# Patient Record
Sex: Male | Born: 2005 | Race: Black or African American | Hispanic: No | Marital: Single | Smoking: Never smoker
Health system: Southern US, Community
[De-identification: ages and names within clinical notes are randomized; demographics above are authoritative.]

## PROBLEM LIST (undated history)

## (undated) DIAGNOSIS — L309 Dermatitis, unspecified: Secondary | ICD-10-CM

## (undated) DIAGNOSIS — J45909 Unspecified asthma, uncomplicated: Secondary | ICD-10-CM

## (undated) DIAGNOSIS — J069 Acute upper respiratory infection, unspecified: Secondary | ICD-10-CM

## (undated) HISTORY — PX: THROAT SURGERY: SHX803

## (undated) HISTORY — DX: Acute upper respiratory infection, unspecified: J06.9

## (undated) HISTORY — PX: CIRCUMCISION: SUR203

## (undated) HISTORY — DX: Dermatitis, unspecified: L30.9

---
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
## 2023-01-02 ENCOUNTER — Ambulatory Visit: Payer: Medicaid Other

## 2023-01-02 ENCOUNTER — Other Ambulatory Visit: Payer: Self-pay

## 2023-01-02 ENCOUNTER — Ambulatory Visit (INDEPENDENT_AMBULATORY_CARE_PROVIDER_SITE_OTHER): Payer: Medicaid Other | Admitting: Family Medicine

## 2023-01-02 VITALS — BP 104/60 | HR 64 | Temp 98.1°F | Resp 16 | Wt 132.4 lb

## 2023-01-02 DIAGNOSIS — L2084 Intrinsic (allergic) eczema: Secondary | ICD-10-CM

## 2023-01-02 DIAGNOSIS — T7800XD Anaphylactic reaction due to unspecified food, subsequent encounter: Secondary | ICD-10-CM

## 2023-01-02 DIAGNOSIS — H1013 Acute atopic conjunctivitis, bilateral: Secondary | ICD-10-CM

## 2023-01-02 DIAGNOSIS — J3089 Other allergic rhinitis: Secondary | ICD-10-CM | POA: Diagnosis not present

## 2023-01-02 DIAGNOSIS — J302 Other seasonal allergic rhinitis: Secondary | ICD-10-CM

## 2023-01-02 DIAGNOSIS — T7800XA Anaphylactic reaction due to unspecified food, initial encounter: Secondary | ICD-10-CM

## 2023-01-02 DIAGNOSIS — L209 Atopic dermatitis, unspecified: Secondary | ICD-10-CM

## 2023-01-02 DIAGNOSIS — H1010 Acute atopic conjunctivitis, unspecified eye: Secondary | ICD-10-CM

## 2023-01-02 DIAGNOSIS — J4540 Moderate persistent asthma, uncomplicated: Secondary | ICD-10-CM

## 2023-01-02 NOTE — Progress Notes (Signed)
[address] [place] [address] Dept: [phone]  FOLLOW UP NOTE  Patient ID: Travis Jones, male    DOB: 09-Mar-2005  Age: 17 y.o. MRN: [id number] Date of Office Visit: 01/02/2023  Assessment  Chief Complaint: Follow-up  HPI Travis Jones is a 17 year old male who presents to the clinic for follow-up visit.  He was last seen in this clinic on 11/21/2022 by Nehemiah Settle, FNP, for evaluation of asthma, allergic rhinitis, allergic conjunctivitis, atopic dermatitis, and food allergy to peanut, tree nuts, and stovetop egg.  He is accompanied by his mother who assists with history.  At today's visit, he reports his asthma has been well-controlled with no shortness of breath, cough, or wheeze with activity or rest.  He is rarely using Flovent 110 or albuterol for asthma control or asthma symptoms.    Allergic rhinitis is reported as moderately well-controlled with occasional nasal symptoms for which he continues levocetirizine daily.  He is not currently using nasal saline rinses or Dymista nasal spray.  His last environmental allergy testing via lab was on 07/18/2022 and was positive to the entire panel.    Atopic dermatitis is reported as moderately well-controlled with occasional red and itchy areas occurring mainly on his face.  He continues a twice a day moisturizing routine as well as occasional tacrolimus.  He continues Dupixent injections with no large or local reactions.  He reports a significant decrease in his symptoms of atopic dermatitis while continuing on Dupixent injections.  He reports that he does feel tingling and some light when he is having an atopic dermatitis flare.  He reports that he frequently does not wear sunscreen or clothing or hat that protects his face from sunlight.  He is not interested in stopping Dupixent at this time or switching to a different medication to control of atopic dermatitis.  We discussed Rinvok, Ebglyss, and Adbry.    He continues to avoid  peanuts, tree nuts, and eggs in all forms with no accidental ingestion or EpiPen use since his last visit to this clinic.  His last food allergy testing by lab was positive to peanuts, tree nuts, and egg.  EpiPen sent is up-to-date.  Drug Allergies:  Allergies  Allergen Reactions   Egg-Derived Products Anaphylaxis   Peanuts [Peanut Oil] Anaphylaxis    Physical Exam: BP 104/60   Pulse 64   Temp 98.1 F (36.7 C) (Temporal)   Resp 16   Wt 132 lb 6.4 oz (60.1 kg)   SpO2 99%   BMI 20.42 kg/m    Physical Exam Vitals reviewed.  Constitutional:      Appearance: Normal appearance.  HENT:     Head: Normocephalic and atraumatic.     Right Ear: Tympanic membrane normal.     Left Ear: Tympanic membrane normal.     Nose:     Comments: Bilateral naris slightly erythematous with thin clear nasal drainage noted.  Pharynx normal.  Ears normal.  Eyes normal.    Mouth/Throat:     Pharynx: Oropharynx is clear.  Eyes:     Conjunctiva/sclera: Conjunctivae normal.  Cardiovascular:     Rate and Rhythm: Normal rate and regular rhythm.     Heart sounds: Normal heart sounds. No murmur heard. Pulmonary:     Effort: Pulmonary effort is normal.     Breath sounds: Normal breath sounds.     Comments: Lungs clear to auscultation Musculoskeletal:        General: Normal range of motion.  Cervical back: Normal range of motion and neck supple.  Skin:    General: Skin is warm and dry.     Comments: Some eczematous patches mainly noted around his eyes.  No open areas or drainage noted.  Neurological:     Mental Status: He is alert and oriented to person, place, and time.  Psychiatric:        Mood and Affect: Mood normal.        Behavior: Behavior normal.        Thought Content: Thought content normal.        Judgment: Judgment normal.     Diagnostics: FVC 4.58 which is 106% of predicted value, FEV1 3.30 which is 89% of predicted value.  Spirometry indicates airway obstruction.   Postbronchodilator spirometry indicates 11% improvement in FEV1.  Follow-up with all  Assessment and Plan: 1. Not well controlled moderate persistent asthma   2. Seasonal and perennial allergic rhinitis   3. Seasonal allergic conjunctivitis   4. Intrinsic atopic dermatitis   5. Anaphylaxis due to food     No orders of the defined types were placed in this encounter.   Patient Instructions  Asthma Begin Flovent 110 mcg 2 puffs twice a day to prevent cough or wheeze. Make sure and use this every day as prescribed. Set a reminder on your phone Continue albuterol 2 puffs once every 4 hours as needed for cough or wheeze You may use albuterol 2 puffs 5 to 15 minutes before activity to decrease cough or wheeze  Asthma control goals:  Full participation in all desired activities (may need albuterol before activity) Albuterol use two time or less a week on average (not counting use with activity) Cough interfering with sleep two time or less a month Oral steroids no more than once a year No hospitalizations  Allergic rhinitis Continue allergen avoidance measures directed toward pollen, mold, dust mites, pets, and cockroach as listed below Continue Xyzal 5 mg once a day as needed for runny nose or itch. Continue Dymista 2 sprays in each nostril as needed for nasal symptoms Consider saline nasal rinses as needed for nasal symptoms. Use this before any medicated nasal sprays for best result Consider allergen immunotherapy if your symptoms are not well-controlled with the treatment plan as listed above  Allergic conjunctivitis Continue olopatadine 1 drop in each eye once a day as needed for red or itchy eyes  Atopic dermatitis Continue a twice a day moisturizing routine Continue Protopic (tacrolimus) use 1 application sparingly twice a day as needed to red and itchy areas. This is not a steroid and safe to use on face and neck Continue triamcinolone (moderate strength steroid)  using 1  application sparingly twice a day as needed to red itchy areas. Do not use on face, neck, groin, or armpit region.   Do not use this medication for longer than 2 weeks in a row Continue clobetasol (strong steroid) to stubborn red and itchy areas. Use one application sparingly once  a day as needed. Do not use on face, neck, groin, or armpit region. Do not use this medication for longer than 1 week in a row  Food allergy Stable Continue to avoid peanuts, tree nuts, and stovetop egg. In case of an allergic reaction, take Benadryl 50 mg every 4 hours, and if life-threatening symptoms occur, inject with EpiPen 0.3 mg.   Call the clinic if this treatment plan is not working well for you.  Follow up in 4 months or sooner  if needed.   Return in about 4 months (around 05/02/2023), or if symptoms worsen or fail to improve.    Thank you for the opportunity to care for this patient.  Please do not hesitate to contact me with questions.  Thermon Leyland, FNP [facility]

## 2023-01-02 NOTE — Patient Instructions (Addendum)
Asthma Begin Flovent 110 mcg 2 puffs twice a day to prevent cough or wheeze. Make sure and use this every day as prescribed. Set a reminder on your phone Continue albuterol 2 puffs once every 4 hours as needed for cough or wheeze You may use albuterol 2 puffs 5 to 15 minutes before activity to decrease cough or wheeze  Asthma control goals:  Full participation in all desired activities (may need albuterol before activity) Albuterol use two time or less a week on average (not counting use with activity) Cough interfering with sleep two time or less a month Oral steroids no more than once a year No hospitalizations  Allergic rhinitis Continue allergen avoidance measures directed toward pollen, mold, dust mites, pets, and cockroach as listed below Continue Xyzal 5 mg once a day as needed for runny nose or itch. Continue Dymista 2 sprays in each nostril as needed for nasal symptoms Consider saline nasal rinses as needed for nasal symptoms. Use this before any medicated nasal sprays for best result Consider allergen immunotherapy if your symptoms are not well-controlled with the treatment plan as listed above  Allergic conjunctivitis Continue olopatadine 1 drop in each eye once a day as needed for red or itchy eyes  Atopic dermatitis Continue a twice a day moisturizing routine Continue Protopic (tacrolimus) use 1 application sparingly twice a day as needed to red and itchy areas. This is not a steroid and safe to use on face and neck Continue triamcinolone (moderate strength steroid)  using 1 application sparingly twice a day as needed to red itchy areas. Do not use on face, neck, groin, or armpit region.   Do not use this medication for longer than 2 weeks in a row Continue clobetasol (strong steroid) to stubborn red and itchy areas. Use one application sparingly once  a day as needed. Do not use on face, neck, groin, or armpit region. Do not use this medication for longer than 1 week in a  row  Food allergy Stable Continue to avoid peanuts, tree nuts, and stovetop egg. In case of an allergic reaction, take Benadryl 50 mg every 4 hours, and if life-threatening symptoms occur, inject with EpiPen 0.3 mg.   Call the clinic if this treatment plan is not working well for you.  Follow up in 4 months or sooner if needed.  Reducing Pollen Exposure The American Academy of Allergy, Asthma and Immunology suggests the following steps to reduce your exposure to pollen during allergy seasons. Do not hang sheets or clothing out to dry; pollen may collect on these items. Do not mow lawns or spend time around freshly cut grass; mowing stirs up pollen. Keep windows closed at night.  Keep car windows closed while driving. Minimize morning activities outdoors, a time when pollen counts are usually at their highest. Stay indoors as much as possible when pollen counts or humidity is high and on windy days when pollen tends to remain in the air longer. Use air conditioning when possible.  Many air conditioners have filters that trap the pollen spores. Use a HEPA room air filter to remove pollen form the indoor air you breathe.  Control of Mold Allergen Mold and fungi can grow on a variety of surfaces provided certain temperature and moisture conditions exist.  Outdoor molds grow on plants, decaying vegetation and soil.  The major outdoor mold, Alternaria and Cladosporium, are found in very high numbers during hot and dry conditions.  Generally, a late Summer - Fall peak is seen  for common outdoor fungal spores.  Rain will temporarily lower outdoor mold spore count, but counts rise rapidly when the rainy period ends.  The most important indoor molds are Aspergillus and Penicillium.  Dark, humid and poorly ventilated basements are ideal sites for mold growth.  The next most common sites of mold growth are the bathroom and the kitchen.  Outdoor [employer] Use air conditioning and keep windows  closed Avoid exposure to decaying vegetation. Avoid leaf raking. Avoid grain handling. Consider wearing a face mask if working in moldy areas.  Indoor Mold Control Maintain humidity below 50%. Clean washable surfaces with 5% bleach solution. Remove sources e.g. Contaminated carpets.   Control of Dust Mite Allergen Dust mites play a major role in allergic asthma and rhinitis. They occur in environments with high humidity wherever human skin is found. Dust mites absorb humidity from the atmosphere (ie, they do not drink) and feed on organic matter (including shed human and animal skin). Dust mites are a microscopic type of insect that you cannot see with the naked eye. High levels of dust mites have been detected from mattresses, pillows, carpets, upholstered furniture, bed covers, clothes, soft toys and any woven material. The principal allergen of the dust mite is found in its feces. A gram of dust may contain 1,000 mites and 250,000 fecal particles. Mite antigen is easily measured in the air during house cleaning activities. Dust mites do not bite and do not cause harm to humans, other than by triggering allergies/asthma.  Ways to decrease your exposure to dust mites in your home:  1. Encase mattresses, box springs and pillows with a mite-impermeable barrier or cover  2. Wash sheets, blankets and drapes weekly in hot water (130 F) with detergent and dry them in a dryer on the hot setting.  3. Have the room cleaned frequently with a vacuum cleaner and a damp dust-mop. For carpeting or rugs, vacuuming with a vacuum cleaner equipped with a high-efficiency particulate air (HEPA) filter. The dust mite allergic individual should not be in a room which is being cleaned and should wait 1 hour after cleaning before going into the room.  4. Do not sleep on upholstered furniture (eg, couches).  5. If possible removing carpeting, upholstered furniture and drapery from the home is ideal. Horizontal  blinds should be eliminated in the rooms where the person spends the most time (bedroom, study, television room). Washable vinyl, roller-type shades are optimal.  6. Remove all non-washable stuffed toys from the bedroom. Wash stuffed toys weekly like sheets and blankets above.  7. Reduce indoor humidity to less than 50%. Inexpensive humidity monitors can be purchased at most hardware stores. Do not use a humidifier as can make the problem worse and are not recommended.  Control of Dog or Cat Allergen Avoidance is the best way to manage a dog or cat allergy. If you have a dog or cat and are allergic to dog or cats, consider removing the dog or cat from the home. If you have a dog or cat but don't want to find it a new home, or if your family wants a pet even though someone in the household is allergic, here are some strategies that may help keep symptoms at bay:  Keep the pet out of your bedroom and restrict it to only a few rooms. Be advised that keeping the dog or cat in only one room will not limit the allergens to that room. Don't pet, hug or kiss the dog  or cat; if you do, wash your hands with soap and water. High-efficiency particulate air (HEPA) cleaners run continuously in a bedroom or living room can reduce allergen levels over time. Regular use of a high-efficiency vacuum cleaner or a central vacuum can reduce allergen levels. Giving your dog or cat a bath at least once a week can reduce airborne allergen.  Control of Cockroach Allergen Cockroach allergen has been identified as an important cause of acute attacks of asthma, especially in urban settings.  There are fifty-five species of cockroach that exist in the [place], however only three, the [place], [place] species produce allergen that can affect patients with Asthma.  Allergens can be obtained from fecal particles, egg casings and secretions from cockroaches.    Remove food sources. Reduce access to water. Seal  access and entry points. Spray runways with 0.5-1% Diazinon or Chlorpyrifos Blow boric acid power under stoves and refrigerator. Place bait stations (hydramethylnon) at feeding sites.

## 2023-01-03 ENCOUNTER — Encounter: Payer: Self-pay | Admitting: Family Medicine

## 2023-01-17 ENCOUNTER — Ambulatory Visit: Payer: Medicaid Other

## 2023-01-21 ENCOUNTER — Other Ambulatory Visit: Payer: Self-pay

## 2023-01-23 ENCOUNTER — Ambulatory Visit: Payer: Medicaid Other | Admitting: *Deleted

## 2023-01-23 ENCOUNTER — Ambulatory Visit: Payer: Medicaid Other

## 2023-01-23 DIAGNOSIS — L209 Atopic dermatitis, unspecified: Secondary | ICD-10-CM | POA: Diagnosis not present

## 2023-01-28 ENCOUNTER — Other Ambulatory Visit (HOSPITAL_COMMUNITY): Payer: Self-pay

## 2023-01-28 NOTE — Progress Notes (Signed)
Specialty Pharmacy Ongoing Clinical Assessment Note  Travis Jones is a 17 y.o. male who is being followed by the specialty pharmacy service for RxSp Atopic Dermatitis   Patient's specialty medication(s) reviewed today: Dupilumab (DUPIXENT)   Missed doses in the last 4 weeks: 0   Patient/Caregiver did not have any additional questions or concerns.   Therapeutic benefit summary: Patient is achieving benefit   Adverse events/side effects summary: No adverse events/side effects   Patient's therapy is appropriate to: Continue    Goals Addressed             This Visit's Progress    Minimize recurrence of flares       Patient is on track. Patient will maintain adherence.  Patient reports that she is well-controlled at this time.         Follow up:  6 months  Servando Snare Specialty Pharmacist

## 2023-01-28 NOTE — Progress Notes (Signed)
Specialty Pharmacy Refill Coordination Note  Travis Jones is a 17 y.o. male contacted today regarding refills of specialty medication(s) Dupilumab (DUPIXENT)   Patient requested Courier to Provider Office   Delivery date: 02/11/23   Verified address: [facility] [address]   Medication will be filled on 02/10/23.

## 2023-02-06 ENCOUNTER — Other Ambulatory Visit: Payer: Self-pay

## 2023-02-10 ENCOUNTER — Other Ambulatory Visit: Payer: Self-pay

## 2023-02-13 ENCOUNTER — Ambulatory Visit (INDEPENDENT_AMBULATORY_CARE_PROVIDER_SITE_OTHER): Payer: Medicaid Other | Admitting: *Deleted

## 2023-02-13 DIAGNOSIS — L209 Atopic dermatitis, unspecified: Secondary | ICD-10-CM

## 2023-02-25 ENCOUNTER — Other Ambulatory Visit (HOSPITAL_COMMUNITY): Payer: Self-pay

## 2023-02-26 ENCOUNTER — Other Ambulatory Visit: Payer: Self-pay

## 2023-02-26 ENCOUNTER — Ambulatory Visit: Payer: Medicaid Other

## 2023-02-26 DIAGNOSIS — L209 Atopic dermatitis, unspecified: Secondary | ICD-10-CM | POA: Diagnosis not present

## 2023-02-26 NOTE — Progress Notes (Signed)
 Specialty Pharmacy Refill Coordination Note  Travis Jones is a 18 y.o. male contacted today regarding refills of specialty medication(s) Dupilumab  (DUPIXENT )   Patient requested Courier to Provider Office   Delivery date: 03/06/23   Verified address: [facility] [address] [place] [address]   Medication will be filled on 03/05/23.

## 2023-03-12 ENCOUNTER — Ambulatory Visit (INDEPENDENT_AMBULATORY_CARE_PROVIDER_SITE_OTHER): Payer: Medicaid Other

## 2023-03-12 DIAGNOSIS — L209 Atopic dermatitis, unspecified: Secondary | ICD-10-CM | POA: Diagnosis not present

## 2023-03-26 ENCOUNTER — Other Ambulatory Visit (HOSPITAL_COMMUNITY): Payer: Self-pay

## 2023-03-26 ENCOUNTER — Other Ambulatory Visit: Payer: Self-pay

## 2023-03-26 ENCOUNTER — Ambulatory Visit: Payer: Medicaid Other

## 2023-03-26 DIAGNOSIS — L209 Atopic dermatitis, unspecified: Secondary | ICD-10-CM

## 2023-03-26 NOTE — Progress Notes (Signed)
Specialty Pharmacy Refill Coordination Note  Travis Jones is a 18 y.o. male contacted today regarding refills of specialty medication(s) Dupilumab (DUPIXENT)   Patient requested Courier to Provider Office   Delivery date: 04/03/23   Verified address: [facility] [address] [place] [address]   Medication will be filled on 04/02/23.

## 2023-04-01 ENCOUNTER — Other Ambulatory Visit: Payer: Self-pay
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
## 2023-04-09 ENCOUNTER — Ambulatory Visit (INDEPENDENT_AMBULATORY_CARE_PROVIDER_SITE_OTHER): Payer: Medicaid Other | Admitting: *Deleted

## 2023-04-09 ENCOUNTER — Other Ambulatory Visit: Payer: Self-pay | Admitting: Allergy

## 2023-04-09 DIAGNOSIS — L209 Atopic dermatitis, unspecified: Secondary | ICD-10-CM

## 2023-04-09 DIAGNOSIS — J310 Chronic rhinitis: Secondary | ICD-10-CM

## 2023-04-18 ENCOUNTER — Ambulatory Visit (INDEPENDENT_AMBULATORY_CARE_PROVIDER_SITE_OTHER): Payer: Medicaid Other | Admitting: Allergy

## 2023-04-18 ENCOUNTER — Encounter: Payer: Self-pay | Admitting: Allergy

## 2023-04-18 VITALS — BP 110/60 | HR 61 | Temp 98.2°F

## 2023-04-18 DIAGNOSIS — J302 Other seasonal allergic rhinitis: Secondary | ICD-10-CM

## 2023-04-18 DIAGNOSIS — H1010 Acute atopic conjunctivitis, unspecified eye: Secondary | ICD-10-CM

## 2023-04-18 DIAGNOSIS — H109 Unspecified conjunctivitis: Secondary | ICD-10-CM

## 2023-04-18 DIAGNOSIS — H1013 Acute atopic conjunctivitis, bilateral: Secondary | ICD-10-CM

## 2023-04-18 DIAGNOSIS — T7800XD Anaphylactic reaction due to unspecified food, subsequent encounter: Secondary | ICD-10-CM

## 2023-04-18 DIAGNOSIS — J4540 Moderate persistent asthma, uncomplicated: Secondary | ICD-10-CM | POA: Diagnosis not present

## 2023-04-18 DIAGNOSIS — J3089 Other allergic rhinitis: Secondary | ICD-10-CM

## 2023-04-18 DIAGNOSIS — L2084 Intrinsic (allergic) eczema: Secondary | ICD-10-CM

## 2023-04-18 DIAGNOSIS — T7800XA Anaphylactic reaction due to unspecified food, initial encounter: Secondary | ICD-10-CM

## 2023-04-18 MED ORDER — LEVOCETIRIZINE DIHYDROCHLORIDE 5 MG PO TABS: 5.0000 mg | ORAL_TABLET | Freq: Every evening | ORAL | 5 refills | Status: AC

## 2023-04-18 MED ORDER — TACROLIMUS 0.1 % EX OINT: TOPICAL_OINTMENT | Freq: Two times a day (BID) | CUTANEOUS | 5 refills | Status: DC | PRN

## 2023-04-18 MED ORDER — EPINEPHRINE 0.3 MG/0.3ML IJ SOAJ: 0.3000 mg | INTRAMUSCULAR | 1 refills | Status: DC | PRN

## 2023-04-18 MED ORDER — CLOBETASOL PROPIONATE 0.05 % EX OINT: 1.0000 | TOPICAL_OINTMENT | Freq: Two times a day (BID) | CUTANEOUS | 5 refills | Status: DC | PRN

## 2023-04-18 MED ORDER — FLUTICASONE PROPIONATE HFA 110 MCG/ACT IN AERO: 2.0000 | INHALATION_SPRAY | Freq: Two times a day (BID) | RESPIRATORY_TRACT | 5 refills | Status: DC

## 2023-04-18 MED ORDER — CROMOLYN SODIUM 4 % OP SOLN: OPHTHALMIC | 5 refills | Status: AC

## 2023-04-18 MED ORDER — TRIAMCINOLONE ACETONIDE 0.5 % EX OINT: TOPICAL_OINTMENT | CUTANEOUS | 5 refills | Status: DC

## 2023-04-18 NOTE — Progress Notes (Signed)
 Follow-up Note  RE: Travis Jones MRN: [id number] DOB: [birth year] Date of Office Visit: 04/18/2023   History of present illness: Travis Jones is a 18 y.o. male presenting today for follow-up of eczema, asthma, allergic rhinitis, food allergy.  He was last seen in the office on 01/02/23 by our nurse practitioner Ambs.  He presents today with his mother.  Discussed the use of AI scribe software for clinical note transcription with the patient, who gave verbal consent to proceed.  He is experiencing increased sensitivity and stinging on the face, particularly when exposed to sunlight, which he attributes to the use of his topical creams. These creams cause a burning sensation, especially when his skin is sensitive. Despite improvements in swelling and skin texture, eczema is spreading and worsening again, including his face. His face occasionally swells, and he experiences tension and fluid accumulation due to eczema. He acknowledges that his skin condition has improved since starting Dupixent, as he no longer experiences severe swelling or hardening of the neck or arms like before. He is currently using topical steroids and non-steroidal treatments protopic, triamcinolone, clobetasol, but he feels his skin is rejecting these medications, leading to further spreading of eczema.  For asthma management, he uses an albuterol inhaler every other week on average for relief of symptoms like coughing/chest tightness.  He is suppose to use Flovent, though does not use as on a daily basis at this time. Dupixent has been beneficial for his asthma and allergies mother believes but does not feel it is working well anymore for his eczema. He also takes Xyzal for allergies as needed and reports that his nasal symptoms have improved, reducing his need for nasal sprays.   He continues to avoid peanuts/tree nuts, stovetop egg.  He has access to epipen device.      Review of systems: 10pt ROS negative  unless noted above in HPI   Past medical/social/surgical/family history have been reviewed and are unchanged unless specifically indicated below.  No changes  Medication List: Current Outpatient Medications  Medication Sig Dispense Refill   albuterol (PROVENTIL) (2.5 MG/3ML) 0.083% nebulizer solution USE 1 VIAL VIA NEB EVERY 4-6 HOURS AS NEEDED FOR COUGH OR WHEEZING 300 mL 0   Chlorphen-Phenyleph-ASA (ALKA-SELTZER PLUS COLD PO) Take 2 tablets by mouth daily as needed.     clobetasol ointment (TEMOVATE) 0.05 % Apply 1 Application topically 2 (two) times daily as needed (eczema). Do not use on face, armpits or genitalia. Do not use for more than 1 week in a row. 60 g 5   cromolyn (OPTICROM) 4 % ophthalmic solution 1-2 drop each eye up to 4 times a day as needed for itch/watery eyes. 10 mL 5   dupilumab (DUPIXENT) 300 MG/2ML prefilled syringe Inject 600 mg into the skin once for 1 dose. Then 300mg  every 14 days 4 mL 11   DYMISTA 137-50 MCG/ACT SUSP Place 1 spray into the nose 2 (two) times daily. 23 g 5   EPINEPHrine (EPIPEN 2-PAK) 0.3 mg/0.3 mL IJ SOAJ injection Inject 0.3 mg into the muscle as needed for anaphylaxis. 0.3 mL 1   EPINEPHrine 0.3 mg/0.3 mL IJ SOAJ injection Inject 0.3 mg into the muscle as needed for anaphylaxis. 2 each 1   fluticasone (FLOVENT HFA) 110 MCG/ACT inhaler 2 puffs three times daily for TWO WEEKS during flares 1 each 5   fluticasone (FLOVENT HFA) 110 MCG/ACT inhaler Inhale 2 puffs into the lungs in the morning and at bedtime. 12 g 5  ketoconazole (NIZORAL) 2 % cream Apply 1 application topically 2 (two) times daily as needed for irritation.     levocetirizine (XYZAL) 5 MG tablet TAKE 1 TABLET BY MOUTH EVERY DAY IN THE EVENING 30 tablet 2   methylphenidate 36 MG PO CR tablet Take 36 mg by mouth every morning.  0   mineral oil-hydrophilic petrolatum (AQUAPHOR) ointment Apply topically as needed for dry skin. 396 g 0   montelukast (SINGULAIR) 10 MG tablet Take 1 tablet  (10 mg total) by mouth daily. 30 tablet 5   tacrolimus (PROTOPIC) 0.1 % ointment Apply topically 2 (two) times daily as needed (eczema). 100 g 5   triamcinolone ointment (KENALOG) 0.5 % 1 application 2 times daily as needed below the face and neck. Do not use for more than 2 weeks in a row. 30 g 5   VENTOLIN HFA 108 (90 Base) MCG/ACT inhaler Inhale 2 puffs into the lungs every 4 (four) hours as needed for wheezing or shortness of breath. 1 each 5   Vitamin D, Ergocalciferol, (DRISDOL) 1.25 MG (50000 UNIT) CAPS capsule Take 50,000 Units by mouth once a week.     Current Facility-Administered Medications  Medication Dose Route Frequency Provider Last Rate Last Admin   dupilumab (DUPIXENT) prefilled syringe 300 mg  300 mg Subcutaneous Q14 Days Marcelyn Bruins, MD   300 mg at 04/09/23 1444     Known medication allergies: Allergies  Allergen Reactions   Egg-Derived Products Anaphylaxis   Peanuts [Peanut Oil] Anaphylaxis     Physical examination: Blood pressure 110/60, pulse 61, temperature 98.2 F (36.8 C), SpO2 99%.  General: Alert, interactive, in no acute distress. HEENT: PERRLA, TMs pearly gray, turbinates non-edematous without discharge, post-pharynx non erythematous. Neck: Supple without lymphadenopathy. Lungs: Clear to auscultation without wheezing, rhonchi or rales. {no increased work of breathing. CV: Normal S1, S2 without murmurs. Abdomen: Nondistended, nontender. Skin: Face skin is moisturized with an ointment; skin around eyes and cheeks erythematous but not edematous; skin of neck is dry and scaling . Extremities:  No clubbing, cyanosis or edema. Neuro:   Grossly intact.  Diagnositics/Labs: None today  Assessment and plan:   Asthma Use Flovent 110 mcg 2 puffs twice a day to prevent cough or wheeze. Make sure and use this every day as prescribed. Set a reminder on your phone.  Use with spacer device.  Continue albuterol 2 puffs once every 4 hours as needed for  cough or wheeze You may use albuterol 2 puffs 5 to 15 minutes before activity to decrease cough or wheeze  Asthma control goals:  Full participation in all desired activities (may need albuterol before activity) Albuterol use two time or less a week on average (not counting use with activity) Cough interfering with sleep two time or less a month Oral steroids no more than once a year No hospitalizations  Allergic rhinitis Continue allergen avoidance measures directed toward pollen, mold, dust mites, pets, and cockroach as listed below Continue Xyzal 5 mg once a day as needed for runny nose or itch. Continue Dymista 2 sprays in each nostril as needed for nasal symptoms Consider saline nasal rinses as needed for nasal symptoms. Use this before any medicated nasal sprays for best result Consider allergen immunotherapy if your symptoms are not well-controlled with the treatment plan as listed above  Allergic conjunctivitis Continue olopatadine 1 drop in each eye once a day as needed for red or itchy eyes  Atopic dermatitis Continue a twice a day moisturizing routine  Continue Protopic (tacrolimus) use 1 application sparingly twice a day as needed to red and itchy areas. This is not a steroid and safe to use on face and neck Continue triamcinolone (moderate strength steroid)  using 1 application sparingly twice a day as needed to red itchy areas. Do not use on face, neck, groin, or armpit region.   Do not use this medication for longer than 2 weeks in a row Continue clobetasol (strong steroid) to stubborn red and itchy areas. Use one application sparingly once  a day as needed. Do not use on face, neck, groin, or armpit region. Do not use this medication for longer than 1 week in a row. Currently on Dupixent however not controlling eczema as we would expect thus discussed changing to different agent Rinvoq.  Rinvoq is a daily tablet for eczema control.  Benefits, risk and protocol discussed.  Will  obtain updated screening labs today and if normal will start approval process with Rinvoq and Tammy, our coordinator, will call you regarding this.  Will place dermatology referral as well to see if they have any other therapies to improve eczema control.   Food allergy Stable Continue to avoid peanuts, tree nuts, and stovetop egg. In case of an allergic reaction, take Benadryl 50 mg every 4 hours, and if life-threatening symptoms occur, inject with EpiPen 0.3 mg.   Follow up in 3 months or sooner if needed.  I appreciate the opportunity to take part in [place] care. Please do not hesitate to contact me with questions.  Sincerely,   Margo Aye, MD Allergy/Immunology [facility]

## 2023-04-18 NOTE — Patient Instructions (Addendum)
 Asthma Use Flovent 110 mcg 2 puffs twice a day to prevent cough or wheeze. Make sure and use this every day as prescribed. Set a reminder on your phone.  Use with spacer device.  Continue albuterol 2 puffs once every 4 hours as needed for cough or wheeze You may use albuterol 2 puffs 5 to 15 minutes before activity to decrease cough or wheeze  Asthma control goals:  Full participation in all desired activities (may need albuterol before activity) Albuterol use two time or less a week on average (not counting use with activity) Cough interfering with sleep two time or less a month Oral steroids no more than once a year No hospitalizations  Allergic rhinitis Continue allergen avoidance measures directed toward pollen, mold, dust mites, pets, and cockroach as listed below Continue Xyzal 5 mg once a day as needed for runny nose or itch. Continue Dymista 2 sprays in each nostril as needed for nasal symptoms Consider saline nasal rinses as needed for nasal symptoms. Use this before any medicated nasal sprays for best result Consider allergen immunotherapy if your symptoms are not well-controlled with the treatment plan as listed above  Allergic conjunctivitis Continue olopatadine 1 drop in each eye once a day as needed for red or itchy eyes  Atopic dermatitis Continue a twice a day moisturizing routine Continue Protopic (tacrolimus) use 1 application sparingly twice a day as needed to red and itchy areas. This is not a steroid and safe to use on face and neck Continue triamcinolone (moderate strength steroid)  using 1 application sparingly twice a day as needed to red itchy areas. Do not use on face, neck, groin, or armpit region.   Do not use this medication for longer than 2 weeks in a row Continue clobetasol (strong steroid) to stubborn red and itchy areas. Use one application sparingly once  a day as needed. Do not use on face, neck, groin, or armpit region. Do not use this medication for  longer than 1 week in a row. Currently on Dupixent however not controlling eczema as we would expect thus discussed changing to different agent Rinvoq.  Rinvoq is a daily tablet for eczema control.  Benefits, risk and protocol discussed.  Will obtain updated screening labs today and if normal will start approval process with Rinvoq and Tammy, our coordinator, will call you regarding this.  Will place dermatology referral as well to see if they have any other therapies to improve eczema control.   Food allergy Stable Continue to avoid peanuts, tree nuts, and stovetop egg. In case of an allergic reaction, take Benadryl 50 mg every 4 hours, and if life-threatening symptoms occur, inject with EpiPen 0.3 mg.   Follow up in 3 months or sooner if needed.

## 2023-04-21 ENCOUNTER — Other Ambulatory Visit (HOSPITAL_COMMUNITY): Payer: Self-pay

## 2023-04-21 ENCOUNTER — Telehealth: Payer: Self-pay

## 2023-04-21 NOTE — Telephone Encounter (Signed)
 Pharmacy Patient Advocate Encounter   Received notification from CoverMyMeds that prior authorization for EPINEPHrine 0.3MG /0.3ML auto-injectors  is required/requested.   Insurance verification completed.   The patient is insured through [facility] MEDICAID .   Per test claim: The current 30 day co-pay is, $0.00.  No PA needed at this time. This test claim was processed through [facility]- copay amounts may vary at other pharmacies due to pharmacy/plan contracts, or as the patient moves through the different stages of their insurance plan.     *called and left message for patient pharmacy to process and fill with appropriate brand covered (test bill for [employer] went through)

## 2023-04-23 ENCOUNTER — Telehealth: Payer: Self-pay

## 2023-04-23 ENCOUNTER — Other Ambulatory Visit: Payer: Self-pay

## 2023-04-23 ENCOUNTER — Ambulatory Visit: Payer: Medicaid Other

## 2023-04-23 DIAGNOSIS — L209 Atopic dermatitis, unspecified: Secondary | ICD-10-CM | POA: Diagnosis not present

## 2023-04-23 LAB — COMPREHENSIVE METABOLIC PANEL
ALT: 47 IU/L — ABNORMAL HIGH (ref 0–44)
AST: 33 IU/L (ref 0–40)
Albumin: 4.5 g/dL (ref 4.3–5.2)
Alkaline Phosphatase: 109 IU/L (ref 51–125)
BUN/Creatinine Ratio: 10 (ref 9–20)
BUN: 9 mg/dL (ref 6–20)
Bilirubin Total: 0.7 mg/dL (ref 0.0–1.2)
CO2: 23 mmol/L (ref 20–29)
Calcium: 9.2 mg/dL (ref 8.7–10.2)
Chloride: 104 mmol/L (ref 96–106)
Creatinine, Ser: 0.9 mg/dL (ref 0.76–1.27)
Globulin, Total: 2.5 g/dL (ref 1.5–4.5)
Glucose: 74 mg/dL (ref 70–99)
Potassium: 4.5 mmol/L (ref 3.5–5.2)
Sodium: 141 mmol/L (ref 134–144)
Total Protein: 7 g/dL (ref 6.0–8.5)
eGFR: 126 mL/min/{1.73_m2} (ref >59)

## 2023-04-23 LAB — CBC WITH DIFFERENTIAL/PLATELET
Basophils Absolute: 0.1 10*3/uL (ref 0.0–0.2)
Basos: 1 %
EOS (ABSOLUTE): 1.9 10*3/uL — ABNORMAL HIGH (ref 0.0–0.4)
Eos: 32 %
Hematocrit: 50.1 % (ref 37.5–51.0)
Hemoglobin: 16.5 g/dL (ref 13.0–17.7)
Immature Grans (Abs): 0 10*3/uL (ref 0.0–0.1)
Immature Granulocytes: 0 %
Lymphocytes Absolute: 1.3 10*3/uL (ref 0.7–3.1)
Lymphs: 22 %
MCH: 28.8 pg (ref 26.6–33.0)
MCHC: 32.9 g/dL (ref 31.5–35.7)
MCV: 87 fL (ref 79–97)
Monocytes Absolute: 0.5 10*3/uL (ref 0.1–0.9)
Monocytes: 8 %
Neutrophils Absolute: 2.2 10*3/uL (ref 1.4–7.0)
Neutrophils: 37 %
Platelets: 209 10*3/uL (ref 150–450)
RBC: 5.73 x10E6/uL (ref 4.14–5.80)
RDW: 12.7 % (ref 11.6–15.4)
WBC: 5.9 10*3/uL (ref 3.4–10.8)

## 2023-04-23 LAB — QUANTIFERON-TB GOLD PLUS
QuantiFERON Nil Value: 0 [IU]/mL
QuantiFERON TB1 Ag Value: 0 [IU]/mL
QuantiFERON TB2 Ag Value: 0 [IU]/mL

## 2023-04-23 LAB — LIPID PANEL
Chol/HDL Ratio: 3.3 ratio (ref 0.0–5.0)
Cholesterol, Total: 141 mg/dL (ref 100–169)
HDL: 43 mg/dL (ref >39)
LDL Chol Calc (NIH): 86 mg/dL (ref 0–109)
Triglycerides: 54 mg/dL (ref 0–89)
VLDL Cholesterol Cal: 12 mg/dL (ref 5–40)

## 2023-04-23 LAB — ACUTE VIRAL HEPATITIS (HAV, HBV, HCV)
HCV Ab: NONREACTIVE
Hep A IgM: NEGATIVE
Hep B C IgM: NEGATIVE
Hepatitis B Surface Ag: NEGATIVE

## 2023-04-23 LAB — HCV INTERPRETATION

## 2023-04-23 NOTE — Telephone Encounter (Signed)
 Please clarify if patient has a dose in office for todays appt.

## 2023-04-23 NOTE — Telephone Encounter (Signed)
 Yes patient has one dose.

## 2023-04-23 NOTE — Telephone Encounter (Signed)
 Patient came in to get his Dupixent injection and stated his skin is irritated. Patient skin is peeling and is red/raw, patient is requesting Prednisone. Please advise

## 2023-04-24 MED ORDER — PREDNISONE 10 MG PO TABS: 30.0000 mg | ORAL_TABLET | Freq: Two times a day (BID) | ORAL | 0 refills | Status: AC

## 2023-04-24 NOTE — Addendum Note (Signed)
 Addended by: Robet Leu A on: 04/24/2023 10:10 AM   Modules accepted: Orders

## 2023-04-24 NOTE — Telephone Encounter (Signed)
 Called and spoke to patient and informed him of the note per Dr. Delorse Lek. Patient verbalized understanding and agreed to pick up prednisone from confirmed pharmacy on file.

## 2023-04-29 MED ORDER — RINVOQ 15 MG PO TB24
15.0000 mg | ORAL_TABLET | Freq: Every day | ORAL | 11 refills | Status: AC
  Filled 2023-05-01: qty 30, 30d supply, fill #0
  Filled 2023-05-20: qty 30, 30d supply, fill #1
  Filled 2023-06-23: qty 30, 30d supply, fill #2
  Filled 2023-07-21: qty 30, 30d supply, fill #3
  Filled 2023-08-20: qty 30, 30d supply, fill #4
  Filled 2023-09-16: qty 30, 30d supply, fill #5
  Filled 2023-10-09 – 2023-10-29 (×5): qty 30, 30d supply, fill #6
  Filled 2023-11-21: qty 30, 30d supply, fill #7
  Filled 2023-12-18: qty 30, 30d supply, fill #8
  Filled 2024-01-15: qty 30, 30d supply, fill #9
  Filled 2024-02-11: qty 30, 30d supply, fill #10
  Filled 2024-03-12: qty 30, 30d supply, fill #11

## 2023-04-29 NOTE — Telephone Encounter (Signed)
 Called patient and advised approval and submit to [facility] Rinvoq 15mg  every day to reaplace DUpixent

## 2023-04-29 NOTE — Addendum Note (Signed)
 Addended by: Devoria Glassing on: 04/29/2023 05:07 PM   Modules accepted: Orders

## 2023-04-30 ENCOUNTER — Other Ambulatory Visit: Payer: Self-pay

## 2023-04-30 ENCOUNTER — Other Ambulatory Visit (HOSPITAL_COMMUNITY): Payer: Self-pay

## 2023-05-01 ENCOUNTER — Other Ambulatory Visit: Payer: Self-pay

## 2023-05-01 ENCOUNTER — Other Ambulatory Visit (HOSPITAL_COMMUNITY): Payer: Self-pay

## 2023-05-01 NOTE — Progress Notes (Signed)
 Specialty Pharmacy Initial Fill Coordination Note  Travis Jones is a 18 y.o. male contacted today regarding initial fill of specialty medication(s) Upadacitinib (Rinvoq)   Patient requested Delivery   Delivery date: 05/02/23   Verified address: [address] [place] [address]   Medication will be filled on 03/20.   Patient is aware of $0.00 copayment.

## 2023-05-01 NOTE — Progress Notes (Signed)
 Specialty Pharmacy Initiation Note   Travis Jones is a 18 y.o. male who will be followed by the specialty pharmacy service for RxSp Atopic Dermatitis    Review of administration, indication, effectiveness, safety, potential side effects, storage/disposable, and missed dose instructions occurred today for patient's specialty medication(s) Upadacitinib (Rinvoq)     Patient/Caregiver did not have any additional questions or concerns.   Patient's therapy is appropriate to: Initiate    Goals Addressed             This Visit's Progress    Minimize recurrence of flares       Patient is initiating therapy with Rinvoq (Dupixent discontinued). Patient will maintain adherence         Otto Herb Specialty Pharmacist

## 2023-05-07 ENCOUNTER — Other Ambulatory Visit: Payer: Self-pay

## 2023-05-07 ENCOUNTER — Encounter: Payer: Self-pay | Admitting: Family

## 2023-05-07 ENCOUNTER — Ambulatory Visit (INDEPENDENT_AMBULATORY_CARE_PROVIDER_SITE_OTHER): Admitting: Family

## 2023-05-07 VITALS — BP 102/60 | HR 85 | Temp 98.7°F | Resp 15

## 2023-05-07 DIAGNOSIS — L2084 Intrinsic (allergic) eczema: Secondary | ICD-10-CM

## 2023-05-07 NOTE — Patient Instructions (Addendum)
 Atopic dermatitis-not well controlled Continue a twice a day moisturizing routine Continue Protopic (tacrolimus) use 1 application sparingly twice a day as needed to red and itchy areas. This is not a steroid and safe to use on face and neck Continue triamcinolone (moderate strength steroid)  using 1 application sparingly twice a day as needed to red itchy areas. Do not use on face, neck, groin, or armpit region.   Do not use this medication for longer than 2 weeks in a row Continue clobetasol (strong steroid) to stubborn red and itchy areas. Use one application sparingly once  a day as needed. Do not use on face, neck, groin, or armpit region. Do not use this medication for longer than 1 week in a row. Continue Rinvoq 15 mg daily Will place dermatology referral to [facility] to see if they have any other therapies to improve eczema control.     Follow up in 2 months with Dr. Delorse Lek or sooner if needed.

## 2023-05-07 NOTE — Progress Notes (Signed)
 [address] [place] [address] Dept: [phone]  FOLLOW UP NOTE  Patient ID: Travis Jones, male    DOB: August 19, 2005  Age: 18 y.o. MRN: [id number] Date of Office Visit: 05/07/2023  Assessment  Chief Complaint: Eczema  HPI Travis Jones is a 18 year old male who presents today for an acute visit of eczema.  He was last seen on April 18, 2023 by Dr. Delorse Lek for asthma, allergic rhinitis, allergic conjunctivitis, atopic dermatitis, and food allergy.  His mom is here with him today and helps provide history.  He denies any new diagnosis or surgeries since his last office visit.  Atopic dermatitis: He reports that he finished the round of prednisone prescribed by Dr. Delorse Lek this past Thursday.  While on the prednisone his skin looked normal and he felt good.  After finishing the prednisone his skin got inflamed again.  His mom does feel like his skin looks way better than what it did the day before.  He has pictures on his phone of his skin previously. He started Rinvoq on Saturday.  He is using Aquaphor moisturizer multiple times a day.  If he does not use Aquaphor his skin will feel "like a rock" and it will bleed if it is dry.  He denies any fever or chills.  He reports that right now his skin is sensitive and he is currently not using any of his medicated creams for his face because of his skin being so sensitive.  His face will burn/sting while washing it.  Mom reports that he has previously seen a dermatologist at [facility], but would like to get a referral for [facility] health dermatology.  He was previously on Dupixent, but stopped due to poor control.   Drug Allergies:  Allergies  Allergen Reactions   Egg-Derived Products Anaphylaxis   Peanuts [Peanut Oil] Anaphylaxis    Review of Systems: Negative except as per HPI   Physical Exam: BP 102/60 (BP Location: Right Arm, Patient Position: Sitting, Cuff Size: Normal)   Pulse 85   Temp 98.7 F (37.1 C) (Temporal)    Resp 15   SpO2 98%    Physical Exam HENT:     Head: Normocephalic.     Comments: Pharynx normal, eyes normal, ears normal, nose normal    Right Ear: Tympanic membrane, ear canal and external ear normal.     Left Ear: Tympanic membrane, ear canal and external ear normal.     Nose: Nose normal.     Mouth/Throat:     Mouth: Mucous membranes are moist.     Pharynx: Oropharynx is clear.  Eyes:     Conjunctiva/sclera: Conjunctivae normal.  Cardiovascular:     Rate and Rhythm: Regular rhythm.     Heart sounds: Normal heart sounds.  Pulmonary:     Effort: Pulmonary effort is normal.     Breath sounds: Normal breath sounds.     Comments: Lungs clear to auscultation Musculoskeletal:     Cervical back: Neck supple.  Skin:    General: Skin is warm.     Comments: Erythema noted around eyes and cheeks.  Neck is dry and scaling.  Face is scaling also.  Hyperpigmented areas noted bilateral antecubital fossa  Neurological:     Mental Status: He is alert and oriented to person, place, and time.  Psychiatric:        Mood and Affect: Mood normal.        Behavior: Behavior normal.  Thought Content: Thought content normal.        Judgment: Judgment normal.     Diagnostics:    Assessment and Plan: 1. Intrinsic atopic dermatitis     No orders of the defined types were placed in this encounter.   Patient Instructions  Atopic dermatitis-not well controlled Continue a twice a day moisturizing routine Continue Protopic (tacrolimus) use 1 application sparingly twice a day as needed to red and itchy areas. This is not a steroid and safe to use on face and neck Continue triamcinolone (moderate strength steroid)  using 1 application sparingly twice a day as needed to red itchy areas. Do not use on face, neck, groin, or armpit region.   Do not use this medication for longer than 2 weeks in a row Continue clobetasol (strong steroid) to stubborn red and itchy areas. Use one application  sparingly once  a day as needed. Do not use on face, neck, groin, or armpit region. Do not use this medication for longer than 1 week in a row. Continue Rinvoq 15 mg daily Will place dermatology referral to [facility] to see if they have any other therapies to improve eczema control.     Follow up in 2 months with Dr. Delorse Lek or sooner if needed.  Return in about 2 months (around 07/07/2023), or if symptoms worsen or fail to improve.    Thank you for the opportunity to care for this patient.  Please do not hesitate to contact me with questions.  Nehemiah Settle, FNP [facility]

## 2023-05-08 ENCOUNTER — Telehealth: Payer: Self-pay

## 2023-05-08 NOTE — Telephone Encounter (Signed)
 Referral has been placed to Dr. Kermit Balo office for review.  [facility] [address] [place],  [place]  [address] Main: [phone]  Patient is going to give their office a call today to get scheduled.

## 2023-05-08 NOTE — Telephone Encounter (Signed)
-----   Message from [facility] sent at 04/18/2023  4:50 PM EST ----- Place referral for [facility] to Dr Onalee Hua for severe eczema on biologic agent

## 2023-05-09 ENCOUNTER — Ambulatory Visit

## 2023-05-20 ENCOUNTER — Other Ambulatory Visit: Payer: Self-pay

## 2023-05-20 NOTE — Progress Notes (Signed)
 Specialty Pharmacy Refill Coordination Note  Travis Jones is a 18 y.o. male contacted today regarding refills of specialty medication(s) Upadacitinib (Rinvoq)   Patient requested Delivery   Delivery date: 05/27/23   Verified address: [address] [place] [address]   Medication will be filled on 04.14.25.

## 2023-05-27 ENCOUNTER — Other Ambulatory Visit (HOSPITAL_COMMUNITY): Payer: Self-pay

## 2023-05-27 ENCOUNTER — Other Ambulatory Visit: Payer: Self-pay

## 2023-06-18 ENCOUNTER — Other Ambulatory Visit (HOSPITAL_COMMUNITY): Payer: Self-pay

## 2023-06-23 ENCOUNTER — Other Ambulatory Visit: Payer: Self-pay

## 2023-06-23 NOTE — Progress Notes (Signed)
 Specialty Pharmacy Refill Coordination Note  Travis Jones is a 18 y.o. male contacted today regarding refills of specialty medication(s) Upadacitinib  (Rinvoq )   Patient requested Delivery   Delivery date: 06/27/23   Verified address: [address] [place] [address]   Medication will be filled on 05.15.25.

## 2023-07-21 ENCOUNTER — Other Ambulatory Visit: Payer: Self-pay

## 2023-07-21 NOTE — Progress Notes (Signed)
 Specialty Pharmacy Refill Coordination Note  Travis Jones is a 18 y.o. male contacted today regarding refills of specialty medication(s) Upadacitinib  (Rinvoq )   Patient requested Delivery   Delivery date: 07/23/23   Verified address: [address] [place] [address]   Medication will be filled on 07/22/23.

## 2023-07-24 ENCOUNTER — Ambulatory Visit: Admitting: Allergy

## 2023-08-03 NOTE — Progress Notes (Deleted)
   [address] [place] [address] Dept: [phone]  FOLLOW UP NOTE  Patient ID: OLANDO WILLEMS, male    DOB: 2006/02/04  Age: 18 y.o. MRN: [id number] Date of Office Visit: 08/04/2023  Assessment  Chief Complaint: No chief complaint on file.  HPI SEVEN DOLLENS is a 18 year old male who presents to the clinic for follow-up visit.  He was last seen in this clinic on 05/07/2023 by Wanda Craze, FNP, for evaluation of asthma, allergic rhinitis, and atopic dermatitis.  Discussed the use of AI scribe software for clinical note transcription with the patient, who gave verbal consent to proceed.  History of Present Illness      Drug Allergies:  Allergies  Allergen Reactions   Egg-Derived Products Anaphylaxis   Peanuts [Peanut  Oil] Anaphylaxis    Physical Exam: There were no vitals taken for this visit.   Physical Exam  Diagnostics:    Assessment and Plan: No diagnosis found.  No orders of the defined types were placed in this encounter.   There are no Patient Instructions on file for this visit.  No follow-ups on file.    Thank you for the opportunity to care for this patient.  Please do not hesitate to contact me with questions.  Arlean Mutter, FNP [facility]

## 2023-08-03 NOTE — Patient Instructions (Incomplete)
 Asthma Begin Flovent 110 mcg 2 puffs twice a day to prevent cough or wheeze. Make sure and use this every day as prescribed. Set a reminder on your phone Continue albuterol 2 puffs once every 4 hours as needed for cough or wheeze You may use albuterol 2 puffs 5 to 15 minutes before activity to decrease cough or wheeze  Asthma control goals:  Full participation in all desired activities (may need albuterol before activity) Albuterol use two time or less a week on average (not counting use with activity) Cough interfering with sleep two time or less a month Oral steroids no more than once a year No hospitalizations  Allergic rhinitis Continue allergen avoidance measures directed toward pollen, mold, dust mites, pets, and cockroach as listed below Continue Xyzal 5 mg once a day as needed for runny nose or itch. Continue Dymista 2 sprays in each nostril as needed for nasal symptoms Consider saline nasal rinses as needed for nasal symptoms. Use this before any medicated nasal sprays for best result Consider allergen immunotherapy if your symptoms are not well-controlled with the treatment plan as listed above  Allergic conjunctivitis Continue olopatadine 1 drop in each eye once a day as needed for red or itchy eyes  Atopic dermatitis Continue a twice a day moisturizing routine Continue Protopic (tacrolimus) use 1 application sparingly twice a day as needed to red and itchy areas. This is not a steroid and safe to use on face and neck Continue triamcinolone (moderate strength steroid)  using 1 application sparingly twice a day as needed to red itchy areas. Do not use on face, neck, groin, or armpit region.   Do not use this medication for longer than 2 weeks in a row Continue clobetasol (strong steroid) to stubborn red and itchy areas. Use one application sparingly once  a day as needed. Do not use on face, neck, groin, or armpit region. Do not use this medication for longer than 1 week in a  row  Food allergy Stable Continue to avoid peanuts, tree nuts, and stovetop egg. In case of an allergic reaction, take Benadryl 50 mg every 4 hours, and if life-threatening symptoms occur, inject with EpiPen 0.3 mg.   Call the clinic if this treatment plan is not working well for you.  Follow up in 4 months or sooner if needed.  Reducing Pollen Exposure The American Academy of Allergy, Asthma and Immunology suggests the following steps to reduce your exposure to pollen during allergy seasons. Do not hang sheets or clothing out to dry; pollen may collect on these items. Do not mow lawns or spend time around freshly cut grass; mowing stirs up pollen. Keep windows closed at night.  Keep car windows closed while driving. Minimize morning activities outdoors, a time when pollen counts are usually at their highest. Stay indoors as much as possible when pollen counts or humidity is high and on windy days when pollen tends to remain in the air longer. Use air conditioning when possible.  Many air conditioners have filters that trap the pollen spores. Use a HEPA room air filter to remove pollen form the indoor air you breathe.  Control of Mold Allergen Mold and fungi can grow on a variety of surfaces provided certain temperature and moisture conditions exist.  Outdoor molds grow on plants, decaying vegetation and soil.  The major outdoor mold, Alternaria and Cladosporium, are found in very high numbers during hot and dry conditions.  Generally, a late Summer - Fall peak is seen  for common outdoor fungal spores.  Rain will temporarily lower outdoor mold spore count, but counts rise rapidly when the rainy period ends.  The most important indoor molds are Aspergillus and Penicillium.  Dark, humid and poorly ventilated basements are ideal sites for mold growth.  The next most common sites of mold growth are the bathroom and the kitchen.  Outdoor [employer] Use air conditioning and keep windows  closed Avoid exposure to decaying vegetation. Avoid leaf raking. Avoid grain handling. Consider wearing a face mask if working in moldy areas.  Indoor Mold Control Maintain humidity below 50%. Clean washable surfaces with 5% bleach solution. Remove sources e.g. Contaminated carpets.   Control of Dust Mite Allergen Dust mites play a major role in allergic asthma and rhinitis. They occur in environments with high humidity wherever human skin is found. Dust mites absorb humidity from the atmosphere (ie, they do not drink) and feed on organic matter (including shed human and animal skin). Dust mites are a microscopic type of insect that you cannot see with the naked eye. High levels of dust mites have been detected from mattresses, pillows, carpets, upholstered furniture, bed covers, clothes, soft toys and any woven material. The principal allergen of the dust mite is found in its feces. A gram of dust may contain 1,000 mites and 250,000 fecal particles. Mite antigen is easily measured in the air during house cleaning activities. Dust mites do not bite and do not cause harm to humans, other than by triggering allergies/asthma.  Ways to decrease your exposure to dust mites in your home:  1. Encase mattresses, box springs and pillows with a mite-impermeable barrier or cover  2. Wash sheets, blankets and drapes weekly in hot water (130 F) with detergent and dry them in a dryer on the hot setting.  3. Have the room cleaned frequently with a vacuum cleaner and a damp dust-mop. For carpeting or rugs, vacuuming with a vacuum cleaner equipped with a high-efficiency particulate air (HEPA) filter. The dust mite allergic individual should not be in a room which is being cleaned and should wait 1 hour after cleaning before going into the room.  4. Do not sleep on upholstered furniture (eg, couches).  5. If possible removing carpeting, upholstered furniture and drapery from the home is ideal. Horizontal  blinds should be eliminated in the rooms where the person spends the most time (bedroom, study, television room). Washable vinyl, roller-type shades are optimal.  6. Remove all non-washable stuffed toys from the bedroom. Wash stuffed toys weekly like sheets and blankets above.  7. Reduce indoor humidity to less than 50%. Inexpensive humidity monitors can be purchased at most hardware stores. Do not use a humidifier as can make the problem worse and are not recommended.  Control of Dog or Cat Allergen Avoidance is the best way to manage a dog or cat allergy. If you have a dog or cat and are allergic to dog or cats, consider removing the dog or cat from the home. If you have a dog or cat but don't want to find it a new home, or if your family wants a pet even though someone in the household is allergic, here are some strategies that may help keep symptoms at bay:  Keep the pet out of your bedroom and restrict it to only a few rooms. Be advised that keeping the dog or cat in only one room will not limit the allergens to that room. Don't pet, hug or kiss the dog  or cat; if you do, wash your hands with soap and water. High-efficiency particulate air (HEPA) cleaners run continuously in a bedroom or living room can reduce allergen levels over time. Regular use of a high-efficiency vacuum cleaner or a central vacuum can reduce allergen levels. Giving your dog or cat a bath at least once a week can reduce airborne allergen.  Control of Cockroach Allergen Cockroach allergen has been identified as an important cause of acute attacks of asthma, especially in urban settings.  There are fifty-five species of cockroach that exist in the [place], however only three, the [place], [place] species produce allergen that can affect patients with Asthma.  Allergens can be obtained from fecal particles, egg casings and secretions from cockroaches.    Remove food sources. Reduce access to water. Seal  access and entry points. Spray runways with 0.5-1% Diazinon or Chlorpyrifos Blow boric acid power under stoves and refrigerator. Place bait stations (hydramethylnon) at feeding sites.

## 2023-08-04 ENCOUNTER — Ambulatory Visit: Admitting: Family Medicine

## 2023-08-20 ENCOUNTER — Other Ambulatory Visit: Payer: Self-pay

## 2023-08-20 NOTE — Progress Notes (Signed)
 Specialty Pharmacy Refill Coordination Note  Travis Jones is a 18 y.o. male contacted today regarding refills of specialty medication(s) Upadacitinib  (Rinvoq )   Patient requested Delivery   Delivery date: 08/21/23   Verified address: [address] [place] [address]   Medication will be filled on 08/20/23.

## 2023-08-31 ENCOUNTER — Other Ambulatory Visit: Payer: Self-pay | Admitting: Allergy

## 2023-08-31 DIAGNOSIS — H109 Unspecified conjunctivitis: Secondary | ICD-10-CM

## 2023-09-10 ENCOUNTER — Other Ambulatory Visit (HOSPITAL_COMMUNITY): Payer: Self-pay

## 2023-09-10 NOTE — Progress Notes (Signed)
   [address] [place] [address] Dept: [phone]  FOLLOW UP NOTE  Patient ID: Travis Jones, male    DOB: 04-18-2005  Age: 18 y.o. MRN: [id number] Date of Office Visit: 09/11/2023  Assessment  Chief Complaint: No chief complaint on file.  HPI Travis Jones is a 18 year old male who presents to the clinic for follow-up visit.  He was last seen in this clinic on 05/07/2023 by Wanda Craze, for evaluation of atopic dermatitis with flare.  Prior to that visit he was seen on 04/18/2023 by Dr. Jeneal for evaluation of asthma, allergic rhinitis, allergic conjunctivitis, atopic dermatitis, and food allergy to peanuts, tree nuts, and stovetop egg.  Discussed the use of AI scribe software for clinical note transcription with the patient, who gave verbal consent to proceed.  History of Present Illness      Drug Allergies:  Allergies  Allergen Reactions   Egg-Derived Products Anaphylaxis   Peanuts [Peanut  Oil] Anaphylaxis    Physical Exam: There were no vitals taken for this visit.   Physical Exam  Diagnostics:    Assessment and Plan: No diagnosis found.  No orders of the defined types were placed in this encounter.   There are no Patient Instructions on file for this visit.  No follow-ups on file.    Thank you for the opportunity to care for this patient.  Please do not hesitate to contact me with questions.  Arlean Mutter, FNP [facility]

## 2023-09-10 NOTE — Patient Instructions (Incomplete)
 Asthma Begin Flovent 110 mcg 2 puffs twice a day to prevent cough or wheeze. Make sure and use this every day as prescribed. Set a reminder on your phone Continue albuterol 2 puffs once every 4 hours as needed for cough or wheeze You may use albuterol 2 puffs 5 to 15 minutes before activity to decrease cough or wheeze  Asthma control goals:  Full participation in all desired activities (may need albuterol before activity) Albuterol use two time or less a week on average (not counting use with activity) Cough interfering with sleep two time or less a month Oral steroids no more than once a year No hospitalizations  Allergic rhinitis Continue allergen avoidance measures directed toward pollen, mold, dust mites, pets, and cockroach as listed below Continue Xyzal 5 mg once a day as needed for runny nose or itch. Continue Dymista 2 sprays in each nostril as needed for nasal symptoms Consider saline nasal rinses as needed for nasal symptoms. Use this before any medicated nasal sprays for best result Consider allergen immunotherapy if your symptoms are not well-controlled with the treatment plan as listed above  Allergic conjunctivitis Continue olopatadine 1 drop in each eye once a day as needed for red or itchy eyes  Atopic dermatitis Continue a twice a day moisturizing routine Continue Protopic (tacrolimus) use 1 application sparingly twice a day as needed to red and itchy areas. This is not a steroid and safe to use on face and neck Continue triamcinolone (moderate strength steroid)  using 1 application sparingly twice a day as needed to red itchy areas. Do not use on face, neck, groin, or armpit region.   Do not use this medication for longer than 2 weeks in a row Continue clobetasol (strong steroid) to stubborn red and itchy areas. Use one application sparingly once  a day as needed. Do not use on face, neck, groin, or armpit region. Do not use this medication for longer than 1 week in a  row  Food allergy Stable Continue to avoid peanuts, tree nuts, and stovetop egg. In case of an allergic reaction, take Benadryl 50 mg every 4 hours, and if life-threatening symptoms occur, inject with EpiPen 0.3 mg.   Call the clinic if this treatment plan is not working well for you.  Follow up in 4 months or sooner if needed.  Reducing Pollen Exposure The American Academy of Allergy, Asthma and Immunology suggests the following steps to reduce your exposure to pollen during allergy seasons. Do not hang sheets or clothing out to dry; pollen may collect on these items. Do not mow lawns or spend time around freshly cut grass; mowing stirs up pollen. Keep windows closed at night.  Keep car windows closed while driving. Minimize morning activities outdoors, a time when pollen counts are usually at their highest. Stay indoors as much as possible when pollen counts or humidity is high and on windy days when pollen tends to remain in the air longer. Use air conditioning when possible.  Many air conditioners have filters that trap the pollen spores. Use a HEPA room air filter to remove pollen form the indoor air you breathe.  Control of Mold Allergen Mold and fungi can grow on a variety of surfaces provided certain temperature and moisture conditions exist.  Outdoor molds grow on plants, decaying vegetation and soil.  The major outdoor mold, Alternaria and Cladosporium, are found in very high numbers during hot and dry conditions.  Generally, a late Summer - Fall peak is seen  for common outdoor fungal spores.  Rain will temporarily lower outdoor mold spore count, but counts rise rapidly when the rainy period ends.  The most important indoor molds are Aspergillus and Penicillium.  Dark, humid and poorly ventilated basements are ideal sites for mold growth.  The next most common sites of mold growth are the bathroom and the kitchen.  Outdoor [employer] Use air conditioning and keep windows  closed Avoid exposure to decaying vegetation. Avoid leaf raking. Avoid grain handling. Consider wearing a face mask if working in moldy areas.  Indoor Mold Control Maintain humidity below 50%. Clean washable surfaces with 5% bleach solution. Remove sources e.g. Contaminated carpets.   Control of Dust Mite Allergen Dust mites play a major role in allergic asthma and rhinitis. They occur in environments with high humidity wherever human skin is found. Dust mites absorb humidity from the atmosphere (ie, they do not drink) and feed on organic matter (including shed human and animal skin). Dust mites are a microscopic type of insect that you cannot see with the naked eye. High levels of dust mites have been detected from mattresses, pillows, carpets, upholstered furniture, bed covers, clothes, soft toys and any woven material. The principal allergen of the dust mite is found in its feces. A gram of dust may contain 1,000 mites and 250,000 fecal particles. Mite antigen is easily measured in the air during house cleaning activities. Dust mites do not bite and do not cause harm to humans, other than by triggering allergies/asthma.  Ways to decrease your exposure to dust mites in your home:  1. Encase mattresses, box springs and pillows with a mite-impermeable barrier or cover  2. Wash sheets, blankets and drapes weekly in hot water (130 F) with detergent and dry them in a dryer on the hot setting.  3. Have the room cleaned frequently with a vacuum cleaner and a damp dust-mop. For carpeting or rugs, vacuuming with a vacuum cleaner equipped with a high-efficiency particulate air (HEPA) filter. The dust mite allergic individual should not be in a room which is being cleaned and should wait 1 hour after cleaning before going into the room.  4. Do not sleep on upholstered furniture (eg, couches).  5. If possible removing carpeting, upholstered furniture and drapery from the home is ideal. Horizontal  blinds should be eliminated in the rooms where the person spends the most time (bedroom, study, television room). Washable vinyl, roller-type shades are optimal.  6. Remove all non-washable stuffed toys from the bedroom. Wash stuffed toys weekly like sheets and blankets above.  7. Reduce indoor humidity to less than 50%. Inexpensive humidity monitors can be purchased at most hardware stores. Do not use a humidifier as can make the problem worse and are not recommended.  Control of Dog or Cat Allergen Avoidance is the best way to manage a dog or cat allergy. If you have a dog or cat and are allergic to dog or cats, consider removing the dog or cat from the home. If you have a dog or cat but don't want to find it a new home, or if your family wants a pet even though someone in the household is allergic, here are some strategies that may help keep symptoms at bay:  Keep the pet out of your bedroom and restrict it to only a few rooms. Be advised that keeping the dog or cat in only one room will not limit the allergens to that room. Don't pet, hug or kiss the dog  or cat; if you do, wash your hands with soap and water. High-efficiency particulate air (HEPA) cleaners run continuously in a bedroom or living room can reduce allergen levels over time. Regular use of a high-efficiency vacuum cleaner or a central vacuum can reduce allergen levels. Giving your dog or cat a bath at least once a week can reduce airborne allergen.  Control of Cockroach Allergen Cockroach allergen has been identified as an important cause of acute attacks of asthma, especially in urban settings.  There are fifty-five species of cockroach that exist in the [place], however only three, the [place], [place] species produce allergen that can affect patients with Asthma.  Allergens can be obtained from fecal particles, egg casings and secretions from cockroaches.    Remove food sources. Reduce access to water. Seal  access and entry points. Spray runways with 0.5-1% Diazinon or Chlorpyrifos Blow boric acid power under stoves and refrigerator. Place bait stations (hydramethylnon) at feeding sites.

## 2023-09-11 ENCOUNTER — Ambulatory Visit (INDEPENDENT_AMBULATORY_CARE_PROVIDER_SITE_OTHER): Admitting: Family Medicine

## 2023-09-11 ENCOUNTER — Encounter: Payer: Self-pay | Admitting: Family Medicine

## 2023-09-11 ENCOUNTER — Other Ambulatory Visit: Payer: Self-pay

## 2023-09-11 VITALS — BP 98/70 | HR 80 | Temp 98.6°F | Resp 20 | Ht 67.0 in | Wt 132.0 lb

## 2023-09-11 DIAGNOSIS — J4520 Mild intermittent asthma, uncomplicated: Secondary | ICD-10-CM | POA: Insufficient documentation

## 2023-09-11 DIAGNOSIS — L2084 Intrinsic (allergic) eczema: Secondary | ICD-10-CM | POA: Diagnosis not present

## 2023-09-11 DIAGNOSIS — H1013 Acute atopic conjunctivitis, bilateral: Secondary | ICD-10-CM | POA: Diagnosis not present

## 2023-09-11 DIAGNOSIS — T7800XD Anaphylactic reaction due to unspecified food, subsequent encounter: Secondary | ICD-10-CM

## 2023-09-11 DIAGNOSIS — J302 Other seasonal allergic rhinitis: Secondary | ICD-10-CM

## 2023-09-11 DIAGNOSIS — H1010 Acute atopic conjunctivitis, unspecified eye: Secondary | ICD-10-CM

## 2023-09-11 DIAGNOSIS — J3089 Other allergic rhinitis: Secondary | ICD-10-CM

## 2023-09-11 DIAGNOSIS — T7800XA Anaphylactic reaction due to unspecified food, initial encounter: Secondary | ICD-10-CM

## 2023-09-11 MED ORDER — TRIAMCINOLONE ACETONIDE 0.5 % EX OINT: TOPICAL_OINTMENT | CUTANEOUS | 5 refills | Status: AC

## 2023-09-11 MED ORDER — FLUTICASONE PROPIONATE HFA 110 MCG/ACT IN AERO: 2.0000 | INHALATION_SPRAY | Freq: Two times a day (BID) | RESPIRATORY_TRACT | 5 refills | Status: AC

## 2023-09-11 MED ORDER — EPINEPHRINE 0.3 MG/0.3ML IJ SOAJ: 0.3000 mg | INTRAMUSCULAR | 1 refills | Status: AC | PRN

## 2023-09-11 MED ORDER — CLOBETASOL PROPIONATE 0.05 % EX OINT: 1.0000 | TOPICAL_OINTMENT | Freq: Two times a day (BID) | CUTANEOUS | 5 refills | Status: AC | PRN

## 2023-09-11 MED ORDER — TACROLIMUS 0.1 % EX OINT: TOPICAL_OINTMENT | Freq: Two times a day (BID) | CUTANEOUS | 5 refills | Status: AC | PRN

## 2023-09-16 ENCOUNTER — Other Ambulatory Visit: Payer: Self-pay

## 2023-09-16 NOTE — Progress Notes (Signed)
 Specialty Pharmacy Refill Coordination Note  Travis Jones is a 18 y.o. male contacted today regarding refills of specialty medication(s) Upadacitinib  (Rinvoq )   Patient requested Delivery   Delivery date: 09/18/23   Verified address: [address] [place] [address]   Medication will be filled on 09/17/23.

## 2023-10-09 ENCOUNTER — Other Ambulatory Visit: Payer: Self-pay

## 2023-10-09 ENCOUNTER — Other Ambulatory Visit (HOSPITAL_COMMUNITY): Payer: Self-pay

## 2023-10-09 NOTE — Progress Notes (Signed)
 Benefits Investigation Started  Rejection: Prior Authorization Required  Routed to: Julliana

## 2023-10-10 ENCOUNTER — Other Ambulatory Visit (HOSPITAL_COMMUNITY): Payer: Self-pay

## 2023-10-10 ENCOUNTER — Telehealth: Payer: Self-pay | Admitting: Pharmacy Technician

## 2023-10-10 NOTE — Telephone Encounter (Signed)
 Hello!  Received notification patient's Rinvoq  requires a Prior Authorization.  Thanks!

## 2023-10-10 NOTE — Progress Notes (Signed)
 Message sent to Office. Thanks!

## 2023-10-14 ENCOUNTER — Other Ambulatory Visit: Payer: Self-pay

## 2023-10-15 ENCOUNTER — Other Ambulatory Visit (HOSPITAL_COMMUNITY): Payer: Self-pay

## 2023-10-16 ENCOUNTER — Other Ambulatory Visit: Payer: Self-pay

## 2023-10-23 ENCOUNTER — Other Ambulatory Visit (HOSPITAL_COMMUNITY): Payer: Self-pay

## 2023-10-23 ENCOUNTER — Other Ambulatory Visit: Payer: Self-pay

## 2023-10-24 NOTE — Telephone Encounter (Signed)
 PT mom called to discuss Rinvoq  PA, saying they missed blood work in July - confirmed with Beth that blood work likely needed, so will send to Clinical to review. Advised PT mom will contact [phone] to update if blood work orders are ready to complete

## 2023-10-29 ENCOUNTER — Other Ambulatory Visit: Payer: Self-pay

## 2023-10-29 NOTE — Progress Notes (Signed)
 Specialty Pharmacy Refill Coordination Note  Travis Jones is a 18 y.o. male contacted today regarding refills of specialty medication(s) Upadacitinib  (Rinvoq )   Patient requested Delivery   Delivery date: 10/30/23   Verified address: [address]   [place] [address]   Medication will be filled on 10/29/23. spoke to patient's mom.

## 2023-11-14 NOTE — Telephone Encounter (Signed)
 PT mom called to discuss Rinvoq  and PA - reviewed with Beth, who confirmed that PT needs new blood work order (will get NP, [facility] to fill), and that PT needs to keep DERM appt on 10/29 for PA, that after blood work is obtained the PA can get movement  Advised PT mom of this, she acknowledged and said they do currently have medication, and will reach out next week to do blood work

## 2023-11-21 ENCOUNTER — Other Ambulatory Visit: Payer: Self-pay

## 2023-11-21 NOTE — Progress Notes (Signed)
 Specialty Pharmacy Refill Coordination Note  Travis Jones is a 18 y.o. male contacted today regarding refills of specialty medication(s) Upadacitinib  (Rinvoq )   Patient requested Delivery   Delivery date: 11/26/23   Verified address: [address]   [place] [address]   Medication will be filled on 10.14.25.

## 2023-11-24 ENCOUNTER — Other Ambulatory Visit: Payer: Self-pay

## 2023-12-10 ENCOUNTER — Encounter: Payer: Self-pay | Admitting: Physician Assistant

## 2023-12-10 ENCOUNTER — Ambulatory Visit: Admitting: Physician Assistant

## 2023-12-10 VITALS — BP 128/62

## 2023-12-10 DIAGNOSIS — L209 Atopic dermatitis, unspecified: Secondary | ICD-10-CM | POA: Diagnosis not present

## 2023-12-10 NOTE — Patient Instructions (Signed)

## 2023-12-10 NOTE — Progress Notes (Signed)
   New Patient Visit   Subjective  Travis Jones is a 18 y.o. male NEW PATIENT who presents for the following: Severe eczema - He has had eczema since he was a child but never to this extent until he started college at [employer] [facility] had him on Dupixent  for about 6 months last year but it was not working well so they changed to Rinvoq  in April and that is working better. It is mostly of his face but he gets spots on his arms also. He has TMC and clobetasol  but he does not use them right now. He states that topical steroids make his skin worse. He feels that Vaseline and/or Aquaphor work better than topical steroids. He is also taking singulair  and xyzal  now.  Washes clothes with pods and uses Dove sensitive skin body wash.   Accompanied by his mother today.  The following portions of the chart were reviewed this encounter and updated as appropriate: medications, allergies, medical history  Review of Systems:  No other skin or systemic complaints except as noted in HPI or Assessment and Plan.  Objective  Well appearing patient in no apparent distress; mood and affect are within normal limits.   A focused examination was performed of the following areas: face, neck, back, chest, arms, and legs.    Relevant exam findings are noted in the Assessment and Plan.    Assessment & Plan   ATOPIC DERMATITIS--IMPROVED ON RINVOQ  (prescribed by Allergy and Asthma) Exam: hyperpigmented dry, eczematous patches.   Atopic dermatitis (eczema) is a chronic, relapsing, pruritic condition that can significantly affect quality of life. It is often associated with allergic rhinitis and/or asthma and can require treatment with topical medications, phototherapy, or in severe cases biologic injectable medication (Dupixent ; Adbry) or Oral JAK inhibitors.  Treatment Plan: Recommend Vanicream cleanser.  Recommend he stop laundry pods and start ALL Free and Clear -  samples given.   Recommend he get his bloodwork with Allergy and Asthma and continue Rinvoq  as prescribed by them.     ATOPIC DERMATITIS, UNSPECIFIED TYPE    Return if symptoms worsen or fail to improve.  I, Roseline Hutchinson, CMA, am acting as scribe for Oryan Winterton K, PA-C .   Documentation: I have reviewed the above documentation for accuracy and completeness, and I agree with the above.  Wilma Wuthrich K, PA-C

## 2023-12-18 ENCOUNTER — Other Ambulatory Visit: Payer: Self-pay

## 2023-12-18 ENCOUNTER — Other Ambulatory Visit: Payer: Self-pay | Admitting: Family Medicine

## 2023-12-18 DIAGNOSIS — L2084 Intrinsic (allergic) eczema: Secondary | ICD-10-CM

## 2023-12-22 ENCOUNTER — Other Ambulatory Visit: Payer: Self-pay

## 2023-12-22 NOTE — Progress Notes (Signed)
 Specialty Pharmacy Refill Coordination Note  Travis Jones is a 18 y.o. male contacted today regarding refills of specialty medication(s) Upadacitinib  (Rinvoq )   Patient requested Delivery   Delivery date: 12/24/23   Verified address: [address]   [place]  [id number]   Medication will be filled on: 12/23/23

## 2023-12-23 ENCOUNTER — Ambulatory Visit: Payer: Self-pay | Admitting: Family Medicine

## 2023-12-23 LAB — QUANTIFERON-TB GOLD PLUS
QuantiFERON Mitogen Value: >10 [IU]/mL
QuantiFERON Nil Value: 0.04 [IU]/mL
QuantiFERON TB1 Ag Value: 0.04 [IU]/mL
QuantiFERON TB2 Ag Value: 0.03 [IU]/mL

## 2023-12-23 LAB — COMPREHENSIVE METABOLIC PANEL WITH GFR
ALT: 37 IU/L (ref 0–44)
AST: 24 IU/L (ref 0–40)
Albumin: 4.5 g/dL (ref 4.3–5.2)
Alkaline Phosphatase: 84 IU/L (ref 51–125)
BUN/Creatinine Ratio: 7 — ABNORMAL LOW (ref 9–20)
BUN: 6 mg/dL (ref 6–20)
Bilirubin Total: 1 mg/dL (ref 0.0–1.2)
CO2: 25 mmol/L (ref 20–29)
Calcium: 9.5 mg/dL (ref 8.7–10.2)
Chloride: 101 mmol/L (ref 96–106)
Creatinine, Ser: 0.84 mg/dL (ref 0.76–1.27)
Globulin, Total: 2.8 g/dL (ref 1.5–4.5)
Glucose: 83 mg/dL (ref 70–99)
Potassium: 4.2 mmol/L (ref 3.5–5.2)
Sodium: 139 mmol/L (ref 134–144)
Total Protein: 7.3 g/dL (ref 6.0–8.5)
eGFR: 128 mL/min/1.73 (ref >59)

## 2023-12-23 LAB — LIPID PANEL
Chol/HDL Ratio: 2.8 ratio (ref 0.0–5.0)
Cholesterol, Total: 156 mg/dL (ref 100–199)
HDL: 56 mg/dL (ref >39)
LDL Chol Calc (NIH): 89 mg/dL (ref 0–99)
Triglycerides: 52 mg/dL (ref 0–149)
VLDL Cholesterol Cal: 11 mg/dL (ref 5–40)

## 2023-12-23 LAB — CBC WITH DIFFERENTIAL/PLATELET
Basophils Absolute: 0 x10E3/uL (ref 0.0–0.2)
Basos: 1 %
EOS (ABSOLUTE): 0.4 x10E3/uL (ref 0.0–0.4)
Eos: 10 %
Hematocrit: 44 % (ref 37.5–51.0)
Hemoglobin: 14.6 g/dL (ref 13.0–17.7)
Immature Grans (Abs): 0 x10E3/uL (ref 0.0–0.1)
Immature Granulocytes: 0 %
Lymphocytes Absolute: 1.3 x10E3/uL (ref 0.7–3.1)
Lymphs: 36 %
MCH: 29.4 pg (ref 26.6–33.0)
MCHC: 33.2 g/dL (ref 31.5–35.7)
MCV: 89 fL (ref 79–97)
Monocytes Absolute: 0.3 x10E3/uL (ref 0.1–0.9)
Monocytes: 9 %
Neutrophils Absolute: 1.6 x10E3/uL (ref 1.4–7.0)
Neutrophils: 44 %
Platelets: 222 x10E3/uL (ref 150–450)
RBC: 4.97 x10E6/uL (ref 4.14–5.80)
RDW: 13.1 % (ref 11.6–15.4)
WBC: 3.7 x10E3/uL (ref 3.4–10.8)

## 2023-12-23 LAB — ACUTE VIRAL HEPATITIS (HAV, HBV, HCV)
HCV Ab: NONREACTIVE
Hep A IgM: NEGATIVE
Hep B C IgM: NEGATIVE
Hepatitis B Surface Ag: NEGATIVE

## 2023-12-23 LAB — HCV INTERPRETATION

## 2023-12-23 NOTE — Progress Notes (Signed)
 Can you please let this patient know the labs were all within normal range. I see he had the submission for Rinvoq  go through yesterday. Please have him call with any questions. Thank you

## 2024-01-15 ENCOUNTER — Other Ambulatory Visit: Payer: Self-pay

## 2024-01-19 ENCOUNTER — Other Ambulatory Visit: Payer: Self-pay

## 2024-01-19 NOTE — Progress Notes (Signed)
 Specialty Pharmacy Refill Coordination Note  SHANA YOUNGE is a 18 y.o. male contacted today regarding refills of specialty medication(s) Upadacitinib  (Rinvoq )   Patient requested Delivery   Delivery date: 01/23/24   Verified address: [address]   [place] [address]   Medication will be filled on: 01/22/24

## 2024-01-22 ENCOUNTER — Other Ambulatory Visit: Payer: Self-pay

## 2024-01-29 ENCOUNTER — Other Ambulatory Visit: Payer: Self-pay

## 2024-01-29 ENCOUNTER — Other Ambulatory Visit: Payer: Self-pay | Admitting: Pharmacist

## 2024-01-29 NOTE — Progress Notes (Signed)
 Specialty Pharmacy Ongoing Clinical Assessment Note  Travis Jones is a 18 y.o. male who is being followed by the specialty pharmacy service for RxSp Atopic Dermatitis   Patient's specialty medication(s) reviewed today: Upadacitinib  (Rinvoq )   Missed doses in the last 4 weeks: 0   Patient/Caregiver did not have any additional questions or concerns.   Therapeutic benefit summary: Patient is achieving benefit   Adverse events/side effects summary: No adverse events/side effects   Patient's therapy is appropriate to: Continue    Goals Addressed             This Visit's Progress    Minimize recurrence of flares   On track    Patient is on track with Rinvoq  (Dupixent  discontinued). Patient will maintain adherence         Follow up: 12 months  Lyle LELON Chalk Specialty Pharmacist

## 2024-02-11 ENCOUNTER — Other Ambulatory Visit: Payer: Self-pay

## 2024-02-13 ENCOUNTER — Other Ambulatory Visit: Payer: Self-pay

## 2024-02-13 NOTE — Progress Notes (Signed)
 Specialty Pharmacy Refill Coordination Note  Travis Jones is a 19 y.o. male contacted today regarding refills of specialty medication(s) Upadacitinib  (Rinvoq )   Patient requested Delivery   Delivery date: 02/20/24   Verified address: [address]   [place] [id number]   Medication will be filled on: 02/19/24

## 2024-02-19 ENCOUNTER — Other Ambulatory Visit: Payer: Self-pay

## 2024-03-08 ENCOUNTER — Ambulatory Visit: Admitting: Family Medicine

## 2024-03-12 ENCOUNTER — Other Ambulatory Visit: Payer: Self-pay

## 2024-03-16 ENCOUNTER — Other Ambulatory Visit (HOSPITAL_COMMUNITY): Payer: Self-pay

## 2024-03-19 ENCOUNTER — Other Ambulatory Visit: Payer: Self-pay
